# Patient Record
Sex: Male | Born: 2005
Health system: Southern US, Community
[De-identification: ages and names within clinical notes are randomized; demographics above are authoritative.]

## PROBLEM LIST (undated history)

## (undated) DIAGNOSIS — J309 Allergic rhinitis, unspecified: Secondary | ICD-10-CM

## (undated) DIAGNOSIS — J45909 Unspecified asthma, uncomplicated: Secondary | ICD-10-CM

## (undated) DIAGNOSIS — F909 Attention-deficit hyperactivity disorder, unspecified type: Secondary | ICD-10-CM

## (undated) DIAGNOSIS — F845 Asperger's syndrome: Secondary | ICD-10-CM

## (undated) HISTORY — DX: Asperger's syndrome: F84.5

## (undated) HISTORY — DX: Attention-deficit hyperactivity disorder, unspecified type: F90.9

## (undated) HISTORY — DX: Allergic rhinitis, unspecified: J30.9

## (undated) HISTORY — DX: Unspecified asthma, uncomplicated: J45.909

---
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
## 2024-03-05 ENCOUNTER — Telehealth: Payer: Self-pay | Admitting: Family Medicine

## 2024-03-05 MED ORDER — FLUTICASONE PROPIONATE 50 MCG/ACT NA SUSP
1.0000 | Freq: Every day | NASAL | 5 refills | Status: DC
Start: 2024-03-05 — End: 2024-07-29

## 2024-03-05 NOTE — Telephone Encounter (Signed)
Flonase sent to pharmacy

## 2024-03-05 NOTE — Telephone Encounter (Signed)
 Patients mother called and states that her son was supposed to have been prescribed a nasal spray at his last appointment, that had previously been prescribed by Dr. Kozlow. Mother states this nasal spray was not prescribed, and needs it to be called into [place] on [address] at [facility].

## 2024-05-19 ENCOUNTER — Other Ambulatory Visit: Payer: Self-pay | Admitting: Pediatrics

## 2024-07-25 ENCOUNTER — Other Ambulatory Visit: Payer: Self-pay | Admitting: Pediatrics

## 2024-07-25 ENCOUNTER — Other Ambulatory Visit: Payer: Self-pay | Admitting: Allergy and Immunology

## 2024-07-25 ENCOUNTER — Ambulatory Visit: Payer: MEDICAID | Admitting: Pediatrics

## 2024-07-29 ENCOUNTER — Ambulatory Visit (INDEPENDENT_AMBULATORY_CARE_PROVIDER_SITE_OTHER): Payer: MEDICAID | Admitting: Pediatrics

## 2024-07-29 ENCOUNTER — Encounter: Payer: Self-pay | Admitting: Pediatrics

## 2024-07-29 VITALS — BP 112/68 | Ht 62.25 in | Wt 184.2 lb

## 2024-07-29 DIAGNOSIS — M21371 Foot drop, right foot: Secondary | ICD-10-CM | POA: Diagnosis not present

## 2024-07-29 DIAGNOSIS — F902 Attention-deficit hyperactivity disorder, combined type: Secondary | ICD-10-CM

## 2024-07-29 DIAGNOSIS — M21372 Foot drop, left foot: Secondary | ICD-10-CM

## 2024-07-29 DIAGNOSIS — Z68.41 Body mass index (BMI) pediatric, 5th percentile to less than 85th percentile for age: Secondary | ICD-10-CM

## 2024-07-29 DIAGNOSIS — Z23 Encounter for immunization: Secondary | ICD-10-CM

## 2024-07-29 DIAGNOSIS — R269 Unspecified abnormalities of gait and mobility: Secondary | ICD-10-CM

## 2024-07-29 DIAGNOSIS — F4320 Adjustment disorder, unspecified: Secondary | ICD-10-CM

## 2024-07-29 DIAGNOSIS — Z0000 Encounter for general adult medical examination without abnormal findings: Secondary | ICD-10-CM

## 2024-07-29 DIAGNOSIS — Z0001 Encounter for general adult medical examination with abnormal findings: Secondary | ICD-10-CM

## 2024-07-29 DIAGNOSIS — F913 Oppositional defiant disorder: Secondary | ICD-10-CM

## 2024-07-29 MED ORDER — FLUTICASONE PROPIONATE 50 MCG/ACT NA SUSP: 1.0000 | Freq: Every day | NASAL | 5 refills | Status: AC

## 2024-07-29 MED ORDER — LORATADINE 10 MG PO TABS: 10.0000 mg | ORAL_TABLET | Freq: Every day | ORAL | 5 refills | Status: AC

## 2024-07-29 MED ORDER — LACTULOSE ENCEPHALOPATHY 10 GM/15ML PO SOLN: 10.0000 g | Freq: Two times a day (BID) | ORAL | 12 refills | Status: AC

## 2024-07-29 MED ORDER — CLONIDINE HCL 0.1 MG PO TABS: 0.1000 mg | ORAL_TABLET | Freq: Three times a day (TID) | ORAL | 3 refills | Status: AC

## 2024-07-29 NOTE — Progress Notes (Signed)
 Azstarys Trazadone Fluvoxamine--25mg  daily  [facility] orthotics ---INSERTS for shoes bilaterally    Adolescent Well Care Visit Aaron [name] is a 18 y.o. male who is here for well care.    PCP:  Gustav Alas  Patient History  was provided by the mom and patient.  Confidentiality was discussed with the patient and, if applicable, with caregiver as well.   Current Issues: Current concerns include :  Bilateral foot drop---SMO still in use ---wearing them today Developmental delay Behavior disorder  Back home --[employer] discharged him due to non compliance with medications  F/u with Asthma allergy of [place] SMO's bilaterally  In home Mental Health therapy  Nutrition: Nutrition/Eating Behaviors: good Adequate calcium in diet?: yes Supplements/ Vitamins: yes  Exercise/ Media: Play any Sports?/ Exercise: yes Screen Time:  less than 2 hours a day Media Rules or Monitoring?: yes  Sleep:  Sleep: 8-10 hours  Social Screening: Lives with:  parents Parental relations: good Activities, Work, and Regulatory affairs officer?: yes Concerns regarding behavior with peers?  no Stressors of note: no  Education:  School Grade: completed high school School performance: in therapy School Behavior: in therapy   Confidential Social History: Tobacco?  no Secondhand smoke exposure?  no Drugs/ETOH?  no  Sexually Active?  no   Pregnancy Prevention: N/A  Safe at home, in school & in relationships?  YES Safe to self? YES  Screenings: Patient has a dental home:YES  Discussed the following as issues: eating habits, exercise habits, safety equipment use, bullying, abuse and/or trauma, weapon use, tobacco use, other substance use, reproductive health, and mental health.  Issues were addressed and counseling provided.  Additional topics were addressed as anticipatory guidance.  PHQ-9 --already in therapy  Physical Exam:  Vitals:   07/29/24 0935  BP: 112/68  Weight: 184 lb 3.2 oz (83.6 kg)   Height: 5' 2.25 (1.581 m)   BP 112/68   Ht 5' 2.25 (1.581 m)   Wt 184 lb 3.2 oz (83.6 kg)   BMI 33.42 kg/m  Body mass index: body mass index is 33.42 kg/m. Blood pressure %iles are not available for patients who are 18 years or older.  Hearing Screening   500Hz  1000Hz  2000Hz  3000Hz  4000Hz   Right ear 20 20 20 20 20   Left ear 20 20 20 20 20    Vision Screening   Right eye Left eye Both eyes  Without correction     With correction 10/10 10/10     General Appearance:   alert, oriented, no acute distress and well nourished  HENT: Normocephalic, no obvious abnormality, conjunctiva clear  Mouth:   Normal appearing teeth, no obvious discoloration, dental caries, or dental caps  Neck:   Supple; thyroid: no enlargement, symmetric, no tenderness/mass/nodules  Chest normal  Lungs:   Clear to auscultation bilaterally, normal work of breathing  Heart:   Regular rate and rhythm, S1 and S2 normal, no murmurs;   Abdomen:   Soft, non-tender, no mass, or organomegaly  GU normal male genitals, no testicular masses or hernia  Musculoskeletal:   Tone and strength strong and symmetrical, all extremities --bilateral foot drop              Lymphatic:   No cervical adenopathy  Skin/Hair/Nails:   Skin warm, dry and intact, no rashes, no bruises or petechiae  Neurologic:   Bilateral foot drop with SMO's in place     Assessment and Plan:    Well adolescent male  Bilateral foot drop---renewal of  SMO's  Developmental  delay  Behavior disorder  BMI is appropriate for age  Hearing screening result:normal Vision screening result: normal  Counseling provided for all of the vaccine components  Orders Placed This Encounter  Procedures   For home use only DME Other see comment   Flu vaccine trivalent PF, 6mos and older(Flulaval,Afluria,Fluarix,Fluzone)   CBC with Differential/Platelet   Hemoglobin A1c   Comprehensive metabolic panel with GFR   TSH   T4, free   Lipid panel    Indications, contraindications and side effects of vaccine/vaccines discussed with parent and parent verbally expressed understanding and also agreed with the administration of vaccine/vaccines as ordered above today.Handout (VIS) given for each vaccine at this visit.     Gustav Alas, MD

## 2024-07-29 NOTE — Patient Instructions (Signed)
Preventive Care 19-19 Years Old, Male Preventive care refers to lifestyle choices and visits with your health care provider that can promote health and wellness. At this stage in your life, you may start seeing a primary care physician instead of a pediatrician for your preventive care. Preventive care visits are also called wellness exams. What can I expect for my preventive care visit? Counseling During your preventive care visit, your health care provider may ask about your: Medical history, including: Past medical problems. Family medical history. Current health, including: Home life and relationship well-being. Emotional well-being. Sexual activity and sexual health. Lifestyle, including: Alcohol, nicotine or tobacco, and drug use. Access to firearms. Diet, exercise, and sleep habits. Sunscreen use. Motor vehicle safety. Physical exam Your health care provider may check your: Height and weight. These may be used to calculate your BMI (body mass index). BMI is a measurement that tells if you are at a healthy weight. Waist circumference. This measures the distance around your waistline. This measurement also tells if you are at a healthy weight and may help predict your risk of certain diseases, such as type 2 diabetes and high blood pressure. Heart rate and blood pressure. Body temperature. Skin for abnormal spots. What immunizations do I need?  Vaccines are usually given at various ages, according to a schedule. Your health care provider will recommend vaccines for you based on your age, medical history, and lifestyle or other factors, such as travel or where you work. What tests do I need? Screening Your health care provider may recommend screening tests for certain conditions. This may include: Vision and hearing tests. Lipid and cholesterol levels. Hepatitis B test. Hepatitis C test. HIV (human immunodeficiency virus) test. STI (sexually transmitted infection) testing, if  you are at risk. Tuberculosis skin test. Talk with your health care provider about your test results, treatment options, and if necessary, the need for more tests. Follow these instructions at home: Eating and drinking  Eat a healthy diet that includes fresh fruits and vegetables, whole grains, lean protein, and low-fat dairy products. Drink enough fluid to keep your urine pale yellow. Do not drink alcohol if: Your health care provider tells you not to drink. You are under the legal drinking age. In the [place] the legal drinking age is 21. If you drink alcohol: Limit how much you have to 0-2 drinks a day. Know how much alcohol is in your drink. In the [place] one drink equals one 12 oz bottle of beer (355 mL), one 5 oz glass of wine (148 mL), or one 1 oz glass of hard liquor (44 mL). Lifestyle Brush your teeth every morning and night with fluoride toothpaste. Floss one time each day. Exercise for at least 30 minutes 5 or more days of the week. Do not use any products that contain nicotine or tobacco. These products include cigarettes, chewing tobacco, and vaping devices, such as e-cigarettes. If you need help quitting, ask your health care provider. Do not use drugs. If you are sexually active, practice safe sex. Use a condom or other form of protection to prevent STIs. Find healthy ways to manage stress, such as: Meditation, yoga, or listening to music. Journaling. Talking to a trusted person. Spending time with friends and family. Safety Always wear your seat belt while driving or riding in a vehicle. Do not drive: If you have been drinking alcohol. Do not ride with someone who has been drinking. When you are tired or distracted. While texting. If you have been using   any mind-altering substances or drugs. Wear a helmet and other protective equipment during sports activities. If you have firearms in your house, make sure you follow all gun safety procedures. Seek help if you have  been bullied, physically abused, or sexually abused. Use the internet responsibly to avoid dangers, such as online bullying and online sex predators. What's next? Go to your health care provider once a year for an annual wellness visit. Ask your health care provider how often you should have your eyes and teeth checked. Stay up to date on all vaccines. This information is not intended to replace advice given to you by your health care provider. Make sure you discuss any questions you have with your health care provider. Document Revised: 04/20/2021 Document Reviewed: 04/20/2021 Elsevier Patient Education  2024 [employer]vier Inc.  

## 2024-07-30 ENCOUNTER — Telehealth: Payer: Self-pay | Admitting: Pediatrics

## 2024-07-30 LAB — COMPREHENSIVE METABOLIC PANEL WITH GFR
AG Ratio: 1.9 (calc) (ref 1.0–2.5)
ALT: 18 U/L (ref 8–46)
AST: 19 U/L (ref 12–32)
Albumin: 4.7 g/dL (ref 3.6–5.1)
Alkaline phosphatase (APISO): 79 U/L (ref 46–169)
BUN: 15 mg/dL (ref 7–20)
CO2: 27 mmol/L (ref 20–32)
Calcium: 9.9 mg/dL (ref 8.9–10.4)
Chloride: 105 mmol/L (ref 98–110)
Creat: 1 mg/dL (ref 0.60–1.24)
Globulin: 2.5 g/dL (ref 2.1–3.5)
Glucose, Bld: 111 mg/dL — ABNORMAL HIGH (ref 65–99)
Potassium: 4.2 mmol/L (ref 3.8–5.1)
Sodium: 140 mmol/L (ref 135–146)
Total Bilirubin: 0.2 mg/dL (ref 0.2–1.1)
Total Protein: 7.2 g/dL (ref 6.3–8.2)
eGFR: 111 mL/min/1.73m2 (ref >=60)

## 2024-07-30 LAB — CBC WITH DIFFERENTIAL/PLATELET
Absolute Lymphocytes: 2168 {cells}/uL (ref 850–3900)
Absolute Monocytes: 395 {cells}/uL (ref 200–950)
Basophils Absolute: 31 {cells}/uL (ref 0–200)
Basophils Relative: 0.6 %
Eosinophils Absolute: 120 {cells}/uL (ref 15–500)
Eosinophils Relative: 2.3 %
HCT: 43.5 % (ref 38.5–50.0)
Hemoglobin: 12.8 g/dL — ABNORMAL LOW (ref 13.2–17.1)
MCH: 21.7 pg — ABNORMAL LOW (ref 27.0–33.0)
MCHC: 29.4 g/dL — ABNORMAL LOW (ref 32.0–36.0)
MCV: 73.9 fL — ABNORMAL LOW (ref 80.0–100.0)
Monocytes Relative: 7.6 %
Neutro Abs: 2486 {cells}/uL (ref 1500–7800)
Neutrophils Relative %: 47.8 %
Platelets: 239 Thousand/uL (ref 140–400)
RBC: 5.89 Million/uL — ABNORMAL HIGH (ref 4.20–5.80)
RDW: 15.2 % — ABNORMAL HIGH (ref 11.0–15.0)
Total Lymphocyte: 41.7 %
WBC: 5.2 Thousand/uL (ref 3.8–10.8)

## 2024-07-30 LAB — LIPID PANEL
Cholesterol: 143 mg/dL (ref <170)
HDL: 59 mg/dL (ref >45)
LDL Cholesterol (Calc): 70 mg/dL (ref <110)
Non-HDL Cholesterol (Calc): 84 mg/dL (ref <120)
Total CHOL/HDL Ratio: 2.4 (calc) (ref <5.0)
Triglycerides: 55 mg/dL (ref <90)

## 2024-07-30 LAB — T4, FREE: Free T4: 1.1 ng/dL (ref 0.8–1.4)

## 2024-07-30 LAB — HEMOGLOBIN A1C
Hgb A1c MFr Bld: 5.8 % — ABNORMAL HIGH (ref <5.7)
Mean Plasma Glucose: 120 mg/dL
eAG (mmol/L): 6.6 mmol/L

## 2024-07-30 LAB — TSH: TSH: 1.27 m[IU]/L (ref 0.50–4.30)

## 2024-07-30 NOTE — Telephone Encounter (Signed)
 DME order [id number] faxed to [facility], received SUCCESS, placed in dated 23 folder

## 2024-08-25 ENCOUNTER — Ambulatory Visit: Payer: MEDICAID | Admitting: Pediatrics

## 2024-08-25 ENCOUNTER — Ambulatory Visit: Payer: Self-pay | Admitting: Pediatrics

## 2024-11-07 ENCOUNTER — Other Ambulatory Visit: Payer: Self-pay | Admitting: Family Medicine

## 2024-11-18 ENCOUNTER — Telehealth: Payer: Self-pay | Admitting: Pediatrics

## 2024-11-19 ENCOUNTER — Telehealth: Payer: Self-pay | Admitting: Pediatrics

## 2024-11-19 NOTE — Telephone Encounter (Signed)
 Mother called requesting patient be seen at the same time as sibling on 11/20/24 at 4:15. Mother states he is also in need of another referral for inserts that were in his boots. Mother can be reached at [phone]

## 2024-11-24 NOTE — Telephone Encounter (Signed)
 Was not able to make the appointment that date
